# Patient Record
Sex: Male | Born: 1970 | Race: White | Hispanic: No | Marital: Married | Smoking: Never smoker
Health system: Southern US, Community
[De-identification: ages and names within clinical notes are randomized; demographics above are authoritative.]

## PROBLEM LIST (undated history)

## (undated) DIAGNOSIS — E785 Hyperlipidemia, unspecified: Secondary | ICD-10-CM

## (undated) DIAGNOSIS — R209 Unspecified disturbances of skin sensation: Secondary | ICD-10-CM

## (undated) DIAGNOSIS — E119 Type 2 diabetes mellitus without complications: Secondary | ICD-10-CM

## (undated) DIAGNOSIS — I10 Essential (primary) hypertension: Secondary | ICD-10-CM

## (undated) DIAGNOSIS — G4730 Sleep apnea, unspecified: Secondary | ICD-10-CM

## (undated) HISTORY — DX: Type 2 diabetes mellitus without complications: E11.9

## (undated) HISTORY — DX: Essential (primary) hypertension: I10

## (undated) HISTORY — DX: Unspecified disturbances of skin sensation: R20.9

## (undated) HISTORY — PX: WISDOM TOOTH EXTRACTION: SHX21

## (undated) HISTORY — PX: HAND SURGERY: SHX662

## (undated) HISTORY — DX: Sleep apnea, unspecified: G47.30

## (undated) HISTORY — DX: Hyperlipidemia, unspecified: E78.5

---

## 2012-09-03 ENCOUNTER — Ambulatory Visit (INDEPENDENT_AMBULATORY_CARE_PROVIDER_SITE_OTHER): Payer: BC Managed Care – PPO | Admitting: Neurology

## 2012-09-03 ENCOUNTER — Encounter: Payer: Self-pay | Admitting: Neurology

## 2012-09-03 VITALS — BP 142/80 | HR 73 | Ht 74.25 in | Wt 250.0 lb

## 2012-09-03 DIAGNOSIS — R209 Unspecified disturbances of skin sensation: Secondary | ICD-10-CM

## 2012-09-03 NOTE — Progress Notes (Signed)
Reason for visit: Numbness  Matthew Hogan is a 42 y.o. male  History of present illness:  [name] is a 42 year old right-handed white male with a history of numbness in the feet that dates back at least one year. The patient initially noted numbness mainly involving the middle 3 toes of both feet, the left side being slightly worse than the right. The patient has since noted a very gradual spread of the numbness to include the big toes well. The patient indicates that he has no alteration in balance, and he denies any back pain or pain down the legs. The patient indicates that there is no overt pain in the feet, only numbness and occasional tingling. The patient is able to rest well at night, and the feet do not bother him. The patient reports no numbness in the hands or fingers. The patient denies any neck pain. The patient denies any issues with control of the bowels or the bladder. The patient does not note that the symptoms are worse with overheating. The patient is able to remain quite active. The patient has a very strong family history for diabetes, found in both parents and 3 siblings. The patient himself however, does not have a history of diabetes. The patient has had thyroid studies and a vitamin B12 level checked that were unremarkable. The patient is sent to this office for an evaluation. The numbness in the feet mainly affects the top of the toes.  Past Medical History  Diagnosis Date  . Disturbance of skin sensation     History reviewed. No pertinent past surgical history.  Family History  Problem Relation Age of Onset  . Cancer - Lung Father   . Heart Problems Sister   . Heart Problems Brother   . Diabetes Brother   . Diabetes Brother   . Diabetes Brother     Social history:  reports that he has never smoked. He does not have any smokeless tobacco history on file. He reports that  drinks alcohol. He reports that he does not use illicit drugs.  Medications:  No current  outpatient prescriptions on file prior to visit.   No current facility-administered medications on file prior to visit.    Allergies: No Known Allergies  ROS:  Out of a complete 14 system review of symptoms, the patient complains only of the following symptoms, and all other reviewed systems are negative.  Numbness Insomnia Racing thoughts  Blood pressure 142/80, pulse 73, height 6' 2.25" (1.886 m), weight 250 lb (113.399 kg).  Physical Exam  General: The patient is alert and cooperative at the time of the examination. The patient is minimally obese.  Head: Pupils are equal, round, and reactive to light. Discs are flat bilaterally.  Neck: The neck is supple, no carotid bruits are noted.  Respiratory: The respiratory examination is clear.  Cardiovascular: The cardiovascular examination reveals a regular rate and rhythm, no obvious murmurs or rubs are noted.  Skin: Extremities are without significant edema.  Neurologic Exam  Mental status:  Cranial nerves: Facial symmetry is present. There is good sensation of the face to pinprick and soft touch bilaterally. The strength of the facial muscles and the muscles to head turning and shoulder shrug are normal bilaterally. Speech is well enunciated, no aphasia or dysarthria is noted. Extraocular movements are full. Visual fields are full.  Motor: The motor testing reveals 5 over 5 strength of all 4 extremities. Good symmetric motor tone is noted throughout.  Sensory: Sensory testing  is intact to pinprick, soft touch, vibration sensation, and position sense on all 4 extremities, with the exception that there is a pinprick sensory deficit across the ankles bilaterally. No evidence of extinction is noted.  Coordination: Cerebellar testing reveals good finger-nose-finger and heel-to-shin bilaterally.  Gait and station: Gait is normal. Tandem gait is normal. Romberg is negative. No drift is seen  Reflexes: Deep tendon reflexes are  symmetric and normal bilaterally. The ankle jerk reflexes are well-maintained bilaterally. Toes are downgoing bilaterally.   Assessment/Plan:  1. Bilateral foot numbness  The patient could have a very early peripheral neuropathy, and he has a very strong family history for diabetes. At times, a peripheral neuropathy may precede a diagnosis of diabetes, but his last fasting blood sugar in August of 2013 revealed a blood sugar of 77. The patient will be set up for further blood work, and nerve conduction studies will be done. The patient has well-maintained ankle jerk reflexes, and the peripheral neuropathy maybe too early to pick up on a standard nerve conduction study. The patient will followup in 6-8 months. There is no indication for medications, as the patient does not have pain or discomfort.  [name] MD 09/03/2012 9:15 AM  [facility] [address] [place], [place] [address]  Phone [phone] Fax [phone]

## 2012-09-04 LAB — PROTEIN ELECTROPHORESIS, SERUM: Alpha 1: 0.1 g/dL (ref 0.1–0.4)

## 2012-09-04 LAB — ANGIOTENSIN CONVERTING ENZYME: Angio Convert Enzyme: 48 U/L (ref 14–82)

## 2012-09-05 NOTE — Progress Notes (Signed)
Quick Note:  I called and left a message for the patient that his labs were normal. ______

## 2012-09-07 ENCOUNTER — Ambulatory Visit (INDEPENDENT_AMBULATORY_CARE_PROVIDER_SITE_OTHER): Payer: BC Managed Care – PPO | Admitting: Neurology

## 2012-09-07 ENCOUNTER — Encounter (INDEPENDENT_AMBULATORY_CARE_PROVIDER_SITE_OTHER): Payer: BC Managed Care – PPO

## 2012-09-07 DIAGNOSIS — Z0289 Encounter for other administrative examinations: Secondary | ICD-10-CM

## 2012-09-07 DIAGNOSIS — R209 Unspecified disturbances of skin sensation: Secondary | ICD-10-CM

## 2012-09-07 NOTE — Procedures (Signed)
  HISTORY:  Matthew Hogan is a 42 year old gentleman with a one-year history of some numbness into the middle toes of the feet bilaterally, left greater than right. The patient has no discomfort associated with this, and no low back pain or pain down the legs. The patient has a strong family history of diabetes, but he has no issues with blood sugar himself. The patient is being evaluated for a possible peripheral neuropathy.  NERVE CONDUCTION STUDIES:  Nerve conduction studies were performed on both lower extremities. The distal motor latencies and motor amplitudes for the peroneal and posterior tibial nerves were within normal limits. The nerve conduction velocities for these nerves were also normal. The H reflex latencies were normal. The sensory latencies for the peroneal nerves were within normal limits.    EMG STUDIES:  EMG study was performed on the left lower extremity:  The tibialis anterior muscle reveals 2 to 4K motor units with full recruitment. No fibrillations or positive waves were seen. The peroneus tertius muscle reveals 2 to 4K motor units with full recruitment. No fibrillations or positive waves were seen. The medial gastrocnemius muscle reveals 1 to 3K motor units with full recruitment. No fibrillations or positive waves were seen. The vastus lateralis muscle reveals 2 to 4K motor units with full recruitment. No fibrillations or positive waves were seen. The iliopsoas muscle reveals 2 to 4K motor units with full recruitment. No fibrillations or positive waves were seen. The biceps femoris muscle (long head) reveals 2 to 4K motor units with full recruitment. No fibrillations or positive waves were seen. The lumbosacral paraspinal muscles were tested at 3 levels, and revealed no abnormalities of insertional activity at all 3 levels tested. There was good relaxation.    IMPRESSION:  Nerve conduction studies done on both lower extremities were within normal limits. There is no  evidence of a peripheral neuropathy. A very early peripheral neuropathy however, may be missed on nerve conduction study. Clinical correlation is required. EMG evaluation of the left lower extremity was normal. There is no evidence of a left lumbosacral radiculopathy on this examination.  Matthew Palau MD 09/07/2012 4:14 PM  [facility] [address] [place], [place] [address]  Phone [phone] Fax [phone]

## 2012-10-02 ENCOUNTER — Encounter: Payer: Self-pay | Admitting: Neurology

## 2013-03-12 ENCOUNTER — Encounter: Payer: Self-pay | Admitting: Neurology

## 2013-03-12 ENCOUNTER — Ambulatory Visit (INDEPENDENT_AMBULATORY_CARE_PROVIDER_SITE_OTHER): Payer: BC Managed Care – PPO | Admitting: Neurology

## 2013-03-12 ENCOUNTER — Encounter (INDEPENDENT_AMBULATORY_CARE_PROVIDER_SITE_OTHER): Payer: Self-pay

## 2013-03-12 VITALS — BP 121/76 | HR 71 | Wt 244.0 lb

## 2013-03-12 DIAGNOSIS — R209 Unspecified disturbances of skin sensation: Secondary | ICD-10-CM

## 2013-03-12 NOTE — Progress Notes (Signed)
   Reason for visit: Numbness in the feet  Matthew Hogan is an 42 y.o. male  History of present illness:  Matthew Hogan is a 42 year old right-handed white male with a history of numbness in the toes of the feet. The patient has had nerve conduction studies and blood work done that were unremarkable. It is felt that the patient does have a peripheral neuropathy, but it is too early in the process to show on standard nerve conduction studies, or the neuropathy could be a small fiber neuropathy. The patient denies any problems with back pain, or pain down the legs. The patient denies balance issues or problems with controlling the bowels or the bladder. The patient is noting more discomfort at times in the feet, and he will sometimes have an "icy hot" sensation in the toes. At times, the discomfort keeps him awake at night. The patient comes to this office for an evaluation.  Past Medical History  Diagnosis Date  . Disturbance of skin sensation     History reviewed. No pertinent past surgical history.  Family History  Problem Relation Age of Onset  . Cancer - Lung Father   . Heart Problems Sister   . Heart Problems Brother   . Diabetes Brother   . Diabetes Brother   . Diabetes Brother     Social history:  reports that he has never smoked. He has never used smokeless tobacco. He reports that he drinks alcohol. He reports that he does not use illicit drugs.   No Known Allergies  Medications:  Current Outpatient Prescriptions on File Prior to Visit  Medication Sig Dispense Refill  . aspirin 81 MG chewable tablet Chew 81 mg by mouth daily.       No current facility-administered medications on file prior to visit.    ROS:  Out of a complete 14 system review of symptoms, the patient complains only of the following symptoms, and all other reviewed systems are negative.  Insomnia Numbness in the toes  Blood pressure 121/76, pulse 71, weight 244 lb (110.678 kg).  Physical Exam  General:  The patient is alert and cooperative at the time of the examination.  Skin: No significant peripheral edema is noted.   Neurologic Exam  Cranial nerves: Facial symmetry is present. Speech is normal, no aphasia or dysarthria is noted. Extraocular movements are full. Visual fields are full.  Motor: The patient has good strength in all 4 extremities.  Coordination: The patient has good finger-nose-finger and heel-to-shin bilaterally.  Gait and station: The patient has a normal gait. Tandem gait is normal. Romberg is negative. No drift is seen.  Reflexes: Deep tendon reflexes are symmetric, the ankle jerk reflexes are maintained bilaterally..   Assessment/Plan:  One. Probable peripheral neuropathy  The patient at this point does not wish to start medications for his peripheral neuropathy. At this point we will see patient back in one year, but the patient will contact our office if he desires to go on medication such as gabapentin at night.  [name] MD 03/12/2013 8:35 AM  [facility] [address] [place], [place] [address]  Phone [phone] Fax [phone]

## 2013-07-16 ENCOUNTER — Emergency Department (HOSPITAL_COMMUNITY): Payer: BC Managed Care – PPO

## 2013-07-16 ENCOUNTER — Encounter (HOSPITAL_COMMUNITY): Payer: Self-pay | Admitting: Emergency Medicine

## 2013-07-16 ENCOUNTER — Emergency Department (HOSPITAL_COMMUNITY)
Admission: EM | Admit: 2013-07-16 | Discharge: 2013-07-16 | Disposition: A | Discharge status: Home / Self Care | Payer: BC Managed Care – PPO | Attending: Emergency Medicine | Admitting: Emergency Medicine

## 2013-07-16 DIAGNOSIS — Y939 Activity, unspecified: Secondary | ICD-10-CM | POA: Insufficient documentation

## 2013-07-16 DIAGNOSIS — Y929 Unspecified place or not applicable: Secondary | ICD-10-CM | POA: Insufficient documentation

## 2013-07-16 DIAGNOSIS — S61216A Laceration without foreign body of right little finger without damage to nail, initial encounter: Secondary | ICD-10-CM

## 2013-07-16 DIAGNOSIS — Z7982 Long term (current) use of aspirin: Secondary | ICD-10-CM | POA: Insufficient documentation

## 2013-07-16 DIAGNOSIS — Z23 Encounter for immunization: Secondary | ICD-10-CM | POA: Insufficient documentation

## 2013-07-16 DIAGNOSIS — S61209A Unspecified open wound of unspecified finger without damage to nail, initial encounter: Secondary | ICD-10-CM | POA: Insufficient documentation

## 2013-07-16 DIAGNOSIS — W010XXA Fall on same level from slipping, tripping and stumbling without subsequent striking against object, initial encounter: Secondary | ICD-10-CM | POA: Insufficient documentation

## 2013-07-16 MED ORDER — BUPIVACAINE HCL (PF) 0.25 % IJ SOLN
30.0000 mL | Freq: Once | INTRAMUSCULAR | Status: DC
  Filled 2013-07-16: qty 30

## 2013-07-16 MED ORDER — HYDROCODONE-ACETAMINOPHEN 5-325 MG PO TABS: 1.0000 | ORAL_TABLET | ORAL | Status: DC | PRN

## 2013-07-16 MED ORDER — IBUPROFEN 600 MG PO TABS: 600.0000 mg | ORAL_TABLET | Freq: Three times a day (TID) | ORAL | Status: DC | PRN

## 2013-07-16 MED ORDER — TETANUS-DIPHTH-ACELL PERTUSSIS 5-2.5-18.5 LF-MCG/0.5 IM SUSP
0.5000 mL | Freq: Once | INTRAMUSCULAR | Status: AC
  Administered 2013-07-16: 0.5 mL via INTRAMUSCULAR
  Filled 2013-07-16: qty 0.5

## 2013-07-16 NOTE — ED Notes (Signed)
Dr. Campos in with pt 

## 2013-07-16 NOTE — Discharge Instructions (Signed)

## 2013-07-16 NOTE — ED Provider Notes (Signed)
CSN: [id number]     Arrival date & time 07/16/13  1009 History   First MD Initiated Contact with Patient 07/16/13 1031     Chief Complaint  Patient presents with  . Laceration     (Consider location/radiation/quality/duration/timing/severity/associated sxs/prior Treatment) HPI Patient reports he slipped on the ice when he tried to catch his fall with his right hand it resulted in a laceration of his right little finger.  His pain is moderate in severity at this time.  Tetanus has been updated in the past 5 years.  No other injury or complaints.   Past Medical History  Diagnosis Date  . Disturbance of skin sensation    History reviewed. No pertinent past surgical history. Family History  Problem Relation Age of Onset  . Cancer - Lung Father   . Heart Problems Sister   . Heart Problems Brother   . Diabetes Brother   . Diabetes Brother   . Diabetes Brother    History  Substance Use Topics  . Smoking status: Never Smoker   . Smokeless tobacco: Never Used  . Alcohol Use: Yes     Comment: One beer weekly    Review of Systems  All other systems reviewed and are negative.      Allergies  Erythromycin  Home Medications   Current Outpatient Rx  Name  Route  Sig  Dispense  Refill  . aspirin 81 MG chewable tablet   Oral   Chew 81 mg by mouth daily.         Marland Kitchen HYDROcodone-acetaminophen (NORCO/VICODIN) 5-325 MG per tablet   Oral   Take 1 tablet by mouth every 4 (four) hours as needed for moderate pain.   15 tablet   0   . ibuprofen (ADVIL,MOTRIN) 600 MG tablet   Oral   Take 1 tablet (600 mg total) by mouth every 8 (eight) hours as needed.   15 tablet   0    BP 129/76  Pulse 85  Temp(Src) 98.5 F (36.9 C) (Oral)  Resp 16  SpO2 99% Physical Exam  Nursing note and vitals reviewed. Constitutional: He is oriented to person, place, and time. He appears well-developed and well-nourished.  HENT:  Head: Normocephalic.  Eyes: EOM are normal.  Neck: Normal range  of motion.  Pulmonary/Chest: Effort normal.  Abdominal: He exhibits no distension.  Musculoskeletal: Normal range of motion.  Laceration involving the volar surface of the right little finger.  The laceration overlies the distal and medial phalanx.  This is a angulated laceration with a flap of skin.  The patient has full flexion in the right little finger to suggest the flexor tendon is intact.  There is no bone exposed.  There is no tendon sheath exposed.  Normal perfusion distally.  Neurological: He is alert and oriented to person, place, and time.  Psychiatric: He has a normal mood and affect.    ED Course  Procedures (including critical care time)  NERVE BLOCK Performed by: Lyanne Co Consent: Verbal consent obtained. Required items: required blood products, implants, devices, and special equipment available Time out: Immediately prior to procedure a "time out" was called to verify the correct patient, procedure, equipment, support staff and site/side marked as required. Indication: laceration repair Nerve block body site: right little finger digital nerves Preparation: Patient was prepped and draped in the usual sterile fashion. Needle gauge: 24 G Location technique: anatomical landmarks Local anesthetic: marcaine 0.25% without epi Anesthetic total: 5 ml Outcome: pain improved Patient tolerance: Patient  tolerated the procedure well with no immediate complications.   LACERATION REPAIR Performed by: Lyanne CoAMPOS,Aina Rossbach M Consent: Verbal consent obtained. Risks and benefits: risks, benefits and alternatives were discussed Patient identity confirmed: provided demographic data Time out performed prior to procedure Prepped and Draped in normal sterile fashion Wound explored Complex laceration Laceration Location: Right little finger Laceration Length: 3.5 cm No Foreign Bodies seen or palpated Anesthesia: Nerve block (see note) Irrigation method: syringe Amount of cleaning:  standard Skin closure: Layered closure, 4-0 Prolene, 4-0 Vicryl  Number of sutures or staples: 3 Deep sutures, 8 superficial  Technique: Simple interrupted  Patient tolerance: Patient tolerated the procedure well with no immediate complications.   Labs Review Labs Reviewed - No data to display Imaging Review No results found.  EKG Interpretation   None       MDM   Final diagnoses:  Laceration of right little finger w/o foreign body w/o damage to nail    Infection warnings given.  Sutures removed in 10 days.  Images completed to evaluate for underlying fracture.  Doubt.  No signs of dislocation.  Normal flexion of the fingers.    Lyanne CoKevin M Dewayne Jurek, MD 07/16/13 [phone]

## 2013-07-16 NOTE — ED Notes (Signed)
Pt slipped and fell, not sure what landed on or what grabbed on way down, large lac on right pinky finger

## 2013-07-21 ENCOUNTER — Encounter (HOSPITAL_COMMUNITY): Payer: Self-pay | Admitting: Emergency Medicine

## 2013-07-21 ENCOUNTER — Emergency Department (HOSPITAL_COMMUNITY)
Admission: EM | Admit: 2013-07-21 | Discharge: 2013-07-21 | Disposition: A | Discharge status: Home / Self Care | Payer: BC Managed Care – PPO | Attending: Emergency Medicine | Admitting: Emergency Medicine

## 2013-07-21 DIAGNOSIS — Y838 Other surgical procedures as the cause of abnormal reaction of the patient, or of later complication, without mention of misadventure at the time of the procedure: Secondary | ICD-10-CM | POA: Insufficient documentation

## 2013-07-21 DIAGNOSIS — T148XXA Other injury of unspecified body region, initial encounter: Secondary | ICD-10-CM

## 2013-07-21 DIAGNOSIS — T8140XA Infection following a procedure, unspecified, initial encounter: Secondary | ICD-10-CM | POA: Insufficient documentation

## 2013-07-21 DIAGNOSIS — Z7982 Long term (current) use of aspirin: Secondary | ICD-10-CM | POA: Insufficient documentation

## 2013-07-21 DIAGNOSIS — Z8669 Personal history of other diseases of the nervous system and sense organs: Secondary | ICD-10-CM | POA: Insufficient documentation

## 2013-07-21 DIAGNOSIS — L089 Local infection of the skin and subcutaneous tissue, unspecified: Secondary | ICD-10-CM

## 2013-07-21 MED ORDER — CEPHALEXIN 500 MG PO CAPS
500.0000 mg | ORAL_CAPSULE | Freq: Once | ORAL | Status: AC
  Administered 2013-07-21: 500 mg via ORAL
  Filled 2013-07-21: qty 1

## 2013-07-21 MED ORDER — HYDROCODONE-ACETAMINOPHEN 5-325 MG PO TABS
2.0000 | ORAL_TABLET | Freq: Once | ORAL | Status: AC
  Administered 2013-07-21: 2 via ORAL
  Filled 2013-07-21: qty 2

## 2013-07-21 MED ORDER — SULFAMETHOXAZOLE-TRIMETHOPRIM 800-160 MG PO TABS
1.0000 | ORAL_TABLET | Freq: Two times a day (BID) | ORAL | Status: DC
Start: 2013-07-21 — End: 2014-03-12

## 2013-07-21 MED ORDER — BACITRACIN 500 UNIT/GM EX OINT
1.0000 | TOPICAL_OINTMENT | Freq: Two times a day (BID) | CUTANEOUS | Status: DC
Start: 2013-07-21 — End: 2013-07-21
  Administered 2013-07-21: 1 via TOPICAL
  Filled 2013-07-21: qty 0.9

## 2013-07-21 MED ORDER — CEPHALEXIN 500 MG PO CAPS: ORAL_CAPSULE | ORAL | Status: DC

## 2013-07-21 MED ORDER — SULFAMETHOXAZOLE-TMP DS 800-160 MG PO TABS
1.0000 | ORAL_TABLET | Freq: Once | ORAL | Status: AC
  Administered 2013-07-21: 1 via ORAL
  Filled 2013-07-21: qty 1

## 2013-07-21 NOTE — ED Notes (Signed)
Pt reports he fell and cut right pinkie finger on 2/17, came to ED, 18 stitches in place. Pt reports on Wednesday pt turned black, on Thursday pt wound started oozing puss, still oozing at present. Odor present. Pain 2/10.

## 2013-07-21 NOTE — ED Provider Notes (Signed)
CSN: [id number]     Arrival date & time 07/21/13  1808 History   First MD Initiated Contact with Patient 07/21/13 1840    Chief complaint pain at right fifth finger No chief complaint on file.    (Consider location/radiation/quality/duration/timing/severity/associated sxs/prior Treatment) HPI Patient suffered laceration to his right fifth finger on 07/16/2013. Laceration was repaired here. For the past 2 days his finger feels tight and diffusely sore. He denies nausea. Denies malaise denies fever he's been treating himself with Motrin and Norco partially. No other associated symptoms. Past Medical History  Diagnosis Date  . Disturbance of skin sensation    peripheral neuropathy History reviewed. No pertinent past surgical history. Family History  Problem Relation Age of Onset  . Cancer - Lung Father   . Heart Problems Sister   . Heart Problems Brother   . Diabetes Brother   . Diabetes Brother   . Diabetes Brother    History  Substance Use Topics  . Smoking status: Never Smoker   . Smokeless tobacco: Never Used  . Alcohol Use: Yes     Comment: One beer weekly    Review of Systems  Constitutional: Negative.   Gastrointestinal: Negative.   Skin: Positive for wound.      Allergies  Erythromycin  Home Medications   Current Outpatient Rx  Name  Route  Sig  Dispense  Refill  . aspirin 81 MG chewable tablet   Oral   Chew 81 mg by mouth daily.         Marland Kitchen. HYDROcodone-acetaminophen (NORCO/VICODIN) 5-325 MG per tablet   Oral   Take 1 tablet by mouth every 4 (four) hours as needed for moderate pain.   15 tablet   0   . ibuprofen (ADVIL,MOTRIN) 600 MG tablet   Oral   Take 1 tablet (600 mg total) by mouth every 8 (eight) hours as needed.   15 tablet   0    BP 141/92  Pulse 77  Temp(Src) 97.6 F (36.4 C) (Oral)  Resp 16  SpO2 96% Physical Exam  Vitals reviewed. Constitutional: He appears well-developed and well-nourished.  HENT:  Head: Normocephalic and  atraumatic.  Neck: Normal range of motion.  Cardiovascular: Normal rate.   Pulmonary/Chest: Effort normal.  Abdominal: He exhibits no distension.  Musculoskeletal:  Right hand fifth finger with a sutured laceration on volar aspect wound is irregular. There is gross pus emanating from the wound between the sutures. Mild diffuse tenderness and redness on volar aspect the finger. He is able to flex the finger. Deep flexor and superficial flexor function preserved.   No red streaks proximal to finger. ED Course  Procedures (including critical care time) Labs Review Labs Reviewed - No data to display Imaging Review No results found.  EKG Interpretation   None       MDM   Final diagnoses:  None    all skin sutures were removed. Patient irrigated his finger under faucet copious tap water Spoke with Dr.Gramig plan prescriptions Keflex, Bactrim, patient has Vicodin at home. He will be placed in a bulky dressing with bacitracin ointment. Complaint has been scheduled with Dr.Gramig's office tomorrow at 1 PM. Diagnosis wound infection of right fifth finger   Doug SouSam Lanetra Hartley, MD 07/21/13 2000

## 2013-07-21 NOTE — Discharge Instructions (Signed)
Take your Vicodin as needed for pain. Take the antibiotics as prescribed. Take probiotic as directed. Can be purchased at most pharmacies Eat 1 cup of yogurt daily to prevent diarrhea from antibiotics

## 2014-03-10 ENCOUNTER — Other Ambulatory Visit: Payer: Self-pay | Admitting: Cardiology

## 2014-03-10 DIAGNOSIS — R079 Chest pain, unspecified: Secondary | ICD-10-CM

## 2014-03-12 ENCOUNTER — Encounter: Payer: Self-pay | Admitting: Neurology

## 2014-03-12 ENCOUNTER — Ambulatory Visit (INDEPENDENT_AMBULATORY_CARE_PROVIDER_SITE_OTHER): Payer: BC Managed Care – PPO | Admitting: Neurology

## 2014-03-12 ENCOUNTER — Encounter (INDEPENDENT_AMBULATORY_CARE_PROVIDER_SITE_OTHER): Payer: Self-pay

## 2014-03-12 VITALS — BP 118/80 | HR 69 | Ht 74.0 in | Wt 259.4 lb

## 2014-03-12 DIAGNOSIS — R209 Unspecified disturbances of skin sensation: Secondary | ICD-10-CM

## 2014-03-12 NOTE — Patient Instructions (Signed)

## 2014-03-12 NOTE — Progress Notes (Signed)
    Reason for visit: Numbness  Matthew Hogan is an 43 y.o. male  History of present illness:  [name] is a 43 year old right-handed white male with a history of numbness in the feet that is felt to be related to an early peripheral neuropathy. Nerve conduction studies were normal. The patient has numbness mainly on the ball of the feet, possibly up to the ankle at times. The patient generally is not bothered much by the numbness, but at times, he finds that his feet are hypersensitive. The patient is sleeping fairly well at night, not bothered by the feet. He denies any significant balance issues. He does have some low back pain off and on, without radiation down the legs. He denies issues controlling the bowels or the bladder. He has no numbness on the hands.   Past Medical History  Diagnosis Date  . Disturbance of skin sensation     History reviewed. No pertinent past surgical history.  Family History  Problem Relation Age of Onset  . Cancer - Lung Father   . Heart Problems Sister   . Heart Problems Brother   . Diabetes Brother   . Diabetes Brother   . Diabetes Brother     Social history:  reports that he has never smoked. He has never used smokeless tobacco. He reports that he drinks alcohol. He reports that he does not use illicit drugs.    Allergies  Allergen Reactions  . Erythromycin     Childhood allergy    Medications:  Current Outpatient Prescriptions on File Prior to Visit  Medication Sig Dispense Refill  . aspirin 81 MG chewable tablet Chew 81 mg by mouth daily.       No current facility-administered medications on file prior to visit.    ROS:  Out of a complete 14 system review of symptoms, the patient complains only of the following symptoms, and all other reviewed systems are negative.  Numbness  Insomnia  Blood pressure 118/80, pulse 69, height 6\' 2"  (1.88 m), weight 259 lb 6.4 oz (117.663 kg).  Physical Exam  General: The patient is alert and  cooperative at the time of the examination.  Skin: No significant peripheral edema is noted.   Neurologic Exam  Mental status: The patient is oriented x 3.  Cranial nerves: Facial symmetry is present. Speech is normal, no aphasia or dysarthria is noted. Extraocular movements are full. Visual fields are full.  Motor: The patient has good strength in all 4 extremities.  Sensory examination: Soft touch sensation is symmetric on the face, arms, or legs. The patient has a stocking pattern pinprick sensory deficit up to the ankle bilaterally.   Coordination: The patient has good finger-nose-finger and heel-to-shin bilaterally.  Gait and station: The patient has a normal gait. Tandem gait is normal. Romberg is negative. No drift is seen. The patient is able to walk on heels and the toes bilaterally.   Reflexes: Deep tendon reflexes are symmetric.   Assessment/Plan:   1. Numbness of the feet  The patient likely does have a very early peripheral neuropathy. The patient does not wish to go on medications for the neuropathy as the discomfort is relatively minor at this time. He will contact me if he changes his mind. Otherwise he will followup in 9-12 months.   [name]. Keith [name] MD 03/12/2014 12:37 PM  [facility] [address] [place], [place] [address]  Phone [phone] Fax [phone]\

## 2014-03-14 ENCOUNTER — Other Ambulatory Visit: Payer: BC Managed Care – PPO

## 2014-03-19 ENCOUNTER — Ambulatory Visit
Admission: RE | Admit: 2014-03-19 | Discharge: 2014-03-19 | Disposition: A | Discharge status: Home / Self Care | Payer: No Typology Code available for payment source | Source: Ambulatory Visit | Attending: Cardiology | Admitting: Cardiology

## 2014-03-19 DIAGNOSIS — R079 Chest pain, unspecified: Secondary | ICD-10-CM

## 2014-03-20 ENCOUNTER — Other Ambulatory Visit: Payer: BC Managed Care – PPO

## 2014-11-24 DIAGNOSIS — Z0289 Encounter for other administrative examinations: Secondary | ICD-10-CM

## 2014-12-11 ENCOUNTER — Encounter: Payer: Self-pay | Admitting: Adult Health

## 2014-12-11 ENCOUNTER — Ambulatory Visit (INDEPENDENT_AMBULATORY_CARE_PROVIDER_SITE_OTHER): Payer: BLUE CROSS/BLUE SHIELD | Admitting: Adult Health

## 2014-12-11 VITALS — BP 133/88 | HR 66 | Ht 74.0 in | Wt 266.0 lb

## 2014-12-11 DIAGNOSIS — R209 Unspecified disturbances of skin sensation: Secondary | ICD-10-CM | POA: Diagnosis not present

## 2014-12-11 NOTE — Patient Instructions (Signed)
Overall your symptoms have remained stable.  If you start to experience burning/tingling pain we could start gabapentin. If your symptoms worsen or you develop new symptoms please let us know.

## 2014-12-11 NOTE — Progress Notes (Signed)
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 Patient returns today for follow-up. Patient states that the numbness is mainly on the ball of the feet and will occasionally extend to the ankles. He states maybe once a month he'll have a severe episode where he feels like he is wearing heavy boots on both feet. He states these episodes last one or 2 days and then it resolves. His feet continue to be hypersensitive at times. He states that he jumped into water and had to immediately get out due to his feet. Patient denies any changes with his gait or balance. Denies any issues with his bowels or bladder. No changes in his vision. Denies any new neurological symptoms. He returns today for an evaluation.  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 Nerve conduction studies were normal. The patient has numbness mainly on the ball of the feet, possibly up to the ankle at times. The patient generally is not bothered much by the numbness, but at times, he finds that his feet are hypersensitive. The patient is sleeping fairly well at night, not bothered by the feet. He denies any significant balance issues. He does have some low back pain off and on, without radiation down the legs. He denies issues controlling the bowels or the bladder. He has no numbness on the hands.   REVIEW OF SYSTEMS: Out of a complete 14 system review of symptoms, the patient complains only of the following symptoms, and all other reviewed systems are negative.  See HPI  ALLERGIES: Allergies  Allergen Reactions  . Erythromycin     Childhood allergy    HOME  MEDICATIONS: Outpatient Prescriptions Prior to Visit  Medication Sig Dispense Refill  . aspirin 81 MG chewable tablet Chew 81 mg by mouth daily.     No facility-administered medications prior to visit.    PAST MEDICAL HISTORY: Past Medical History  Diagnosis Date  . Disturbance of skin sensation     PAST SURGICAL HISTORY: Past Surgical History  Procedure Laterality Date  . Wisdom tooth extraction    . Hand surgery Right     FAMILY HISTORY: Family History  Problem Relation Age of Onset  . Cancer - Lung Father   . Heart Problems Sister   . Heart Problems Brother   . Diabetes Brother   . Diabetes Brother   . Diabetes Brother     SOCIAL HISTORY: History   Social History  . Marital Status: Married    Spouse Name: Idalia Needle  . Number of Children: 2  . Years of Education: College   Occupational History  .      Self employed   Social History Main Topics  . Smoking status: Never Smoker   . Smokeless tobacco: Never Used  . Alcohol Use: 0.0 oz/week    0 Standard drinks or equivalent per week     Comment: One beer weekly  . Drug Use: No  . Sexual Activity: Not on file   Other Topics Concern  . Not on file   Social History Narrative   Patient lives at home with his wife Idalia Needle).   Patient works  full time.   Education college.   Right handed.   Caffeine sweet daily two cups.      PHYSICAL EXAM  Filed Vitals:   12/11/14 0832  BP: 133/88  Pulse: 66  Height: 6\' 2"  (1.88 m)  Weight: 266 lb (120.657 kg)   Body mass index is 34.14 kg/(m^2).  Generalized: Well developed, in no acute distress   Neurological examination  Mentation: Alert oriented to time, place, history taking. Follows all commands speech and language fluent Cranial nerve II-XII: Pupils were equal round reactive to light. Extraocular movements were full, visual field were full on confrontational test. Facial sensation and strength were normal. Uvula tongue midline. Head turning and shoulder  shrug  were normal and symmetric. Motor: The motor testing reveals 5 over 5 strength of all 4 extremities. Good symmetric motor tone is noted throughout.  Sensory: Sensory testing is intact to soft touch, pinprick and vibration sensation on all 4 extremities. No evidence of extinction is noted.  Coordination: Cerebellar testing reveals good finger-nose-finger and heel-to-shin bilaterally.  Gait and station: Gait is normal. Tandem gait is normal. Romberg is negative. No drift is seen.  Reflexes: Deep tendon reflexes are symmetric and normal bilaterally.   DIAGNOSTIC DATA (LABS, IMAGING, TESTING) - I reviewed patient records, labs, notes, testing and imaging myself where available.     ASSESSMENT AND PLAN 44 y.o. year old male  has a past medical history of Disturbance of skin sensation. here with:  1. Disturbance of skin sensation  Overall the patient has remained stable. He continues to mainly have numbness with minimal discomfort. We will continue to monitor this. Patient advised that if he starts to experience burning or tingling pain in the feet he should let us know. Of course if his symptoms worsen or he develops new symptoms he should let us know as well. He will follow-up in one year or sooner if needed.  I spent 15 minutes with the patient. 50% of this time was spent counseling the patient on signs and symptoms of neuropathy.  Butch PennyMegan Borden Thune, MSN, NP-C 12/11/2014, 8:35 AM [facility] [address], [address] [place], [place] [address] [phone]  Note: This document was prepared with digital dictation and possible smart phrase technology. Any transcriptional errors that result from this process are unintentional.

## 2014-12-11 NOTE — Progress Notes (Signed)
I have read the note, and I agree with the clinical assessment and plan.  Gaynor Genco KEITH   

## 2015-12-15 ENCOUNTER — Ambulatory Visit (INDEPENDENT_AMBULATORY_CARE_PROVIDER_SITE_OTHER): Payer: BLUE CROSS/BLUE SHIELD | Admitting: Adult Health

## 2015-12-15 ENCOUNTER — Telehealth: Payer: Self-pay

## 2015-12-15 ENCOUNTER — Encounter: Payer: Self-pay | Admitting: Adult Health

## 2015-12-15 VITALS — BP 128/90 | HR 78 | Ht 74.0 in | Wt 264.8 lb

## 2015-12-15 DIAGNOSIS — R209 Unspecified disturbances of skin sensation: Secondary | ICD-10-CM

## 2015-12-15 NOTE — Patient Instructions (Signed)
  Overall he remained stable. We will continue to monitor symptoms. If your symptoms worsen or you develop new symptoms please let us know.

## 2015-12-15 NOTE — Telephone Encounter (Addendum)
Entered in error

## 2015-12-15 NOTE — Progress Notes (Signed)
I agree with the above plan 

## 2015-12-15 NOTE — Progress Notes (Signed)
PATIENT: Matthew Hogan DOB: [birth year]  REASON FOR VISIT: follow up- numbness HISTORY FROM: patient  HISTORY OF PRESENT ILLNESS: [name] is a 45 year old male with a history of numbness in the feet. He returns today for his yearly visit. He reports that his symptoms have remained relatively the same. He continues to have numbness in the ball of the feet that extends to the ankles. He reports that this numbness is intermittent. Some days are worse than others he does have days that he does not notice any numbness. Occasionally with the numbness he will have a mild "electric-like pain."He does report that his feet are hypersensitive at times. He denies any changes with his gait or balance. No changes with his vision, bowels or bladder. He returns today for an evaluation.  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 Patient returns today for follow-up. Patient states that the numbness is mainly on the ball of the feet and will occasionally extend to the ankles. He states maybe once a month he'll have a severe episode where he feels like he is wearing heavy boots on both feet. He states these episodes last one or 2 days and then it resolves. His feet continue to be hypersensitive at times. He states that he jumped into water and had to immediately get out due to his feet. Patient denies any changes with his gait or balance. Denies any issues with his bowels or bladder. No changes in his vision. Denies any new neurological symptoms. He returns today for an evaluation.  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 Nerve conduction studies were normal. The patient has numbness mainly on the ball of the feet, possibly up to the ankle at times. The patient generally is not bothered  much by the numbness, but at times, he finds that his feet are hypersensitive. The patient is sleeping fairly well at night, not bothered by the feet. He denies any significant balance issues. He does have some low back pain off and on, without radiation down the legs. He denies issues controlling the bowels or the bladder. He has no numbness on the hands.   REVIEW OF SYSTEMS: Out of a complete 14 system review of symptoms, the patient complains only of the following symptoms, and all other reviewed systems are negative.  Apnea, snoring  ALLERGIES: Allergies  Allergen Reactions  . Erythromycin     Childhood allergy    HOME MEDICATIONS: No outpatient prescriptions prior to visit.   No facility-administered medications prior to visit.    PAST MEDICAL HISTORY: Past Medical History  Diagnosis Date  . Disturbance of skin sensation     PAST SURGICAL HISTORY: Past Surgical History  Procedure Laterality Date  . Wisdom tooth extraction    . Hand surgery Right     FAMILY HISTORY: Family History  Problem Relation Age of Onset  . Cancer - Lung Father   . Heart Problems Sister   . Heart Problems Brother   . Diabetes Brother   . Diabetes Brother   . Diabetes Brother     SOCIAL HISTORY: Social History   Social History  . Marital Status: Married    Spouse Name: Matthew Hogan  . Number of Children: 2  . Years of Education: [employer]  Occupational History  .      Self employed   Social History Main Topics  . Smoking status: Never Smoker   . Smokeless tobacco: Never Used  . Alcohol Use: 0.0 oz/week    0 Standard drinks or equivalent per week     Comment: One beer weekly  . Drug Use: No  . Sexual Activity: Not on file   Other Topics Concern  . Not on file   Social History Narrative   Patient lives at home with his wife Matthew Hogan(Paige).   Patient works full time.   Education college.   Right handed.   Caffeine sweet daily two cups.      PHYSICAL EXAM  Filed Vitals:    12/15/15 0838  BP: 128/90  Pulse: 78  Height: 6\' 2"  (1.88 m)  Weight: 264 lb 12.8 oz (120.112 kg)   Body mass index is 33.98 kg/(m^2).  Generalized: Well developed, in no acute distress   Neurological examination  Mentation: Alert oriented to time, place, history taking. Follows all commands speech and language fluent Cranial nerve II-XII: Pupils were equal round reactive to light. Extraocular movements were full, visual field were full on confrontational test. Facial sensation and strength were normal. Uvula tongue midline. Head turning and shoulder shrug  were normal and symmetric. Motor: The motor testing reveals 5 over 5 strength of all 4 extremities. Good symmetric motor tone is noted throughout.  Sensory: Sensory testing is intact to soft touch on all 4 extremities. No evidence of extinction is noted.  Coordination: Cerebellar testing reveals good finger-nose-finger and heel-to-shin bilaterally.  Gait and station: Gait is normal. Tandem gait is normal. Romberg is negative. No drift is seen.  Reflexes: Deep tendon reflexes are symmetric and normal bilaterally.   DIAGNOSTIC DATA (LABS, IMAGING, TESTING) - I reviewed patient records, labs, notes, testing and imaging myself where available.   ASSESSMENT AND PLAN 45 y.o. year old male  has a past medical history of Disturbance of skin sensation. here with:  1. Disturbance of skin sensation  The patient has remained stable. At this time the patient only has numbness in the feet no severe discomfort. We will continue to monitor. Patient advised that if his symptoms worsen or he develops any new symptoms he should let us know. He will follow-up in one year with Dr. Anne HahnWillis.    Butch PennyMegan Dhanvin Szeto, MSN, NP-C 12/15/2015, 8:36 AM [facility] [address], [address] [place], [place] [address] [phone]

## 2015-12-26 IMAGING — CT CT HEART SCORING
1 of 2 series · 11 of 20 positions shown, 14 images · non-contrast
Comparison: No priors.

CLINICAL DATA: 43-year-old male with strong family history of heart
disease (in father and siblings), with history of chest pain for
over a year.

EXAM:
CT HEART FOR CALCIUM SCORING
TECHNIQUE: CT heart was performed on a 64 channel system using prospective ECG
gating. A scout and noncontrast exam (for calcium scoring) were
performed. Note that this exam targets the heart and the chest was
not imaged in its entirety.

[Series 2: smartscore - gated 0.4 sec · axial · 0.49mm/px · z∈[-223,-108]mm · 11 of 56 slices shown, 14 images]
[im 5/56  vessel]
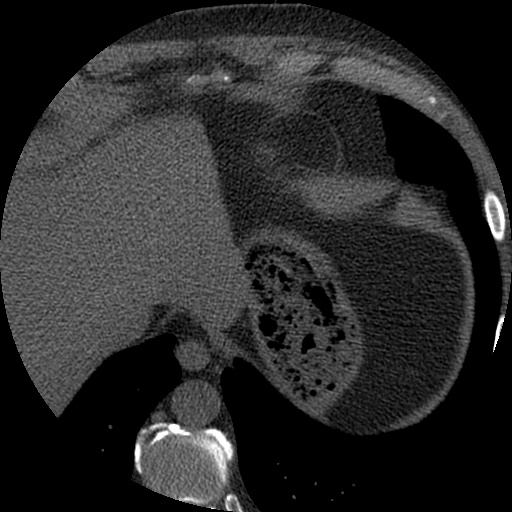
[im 5/56  lung]
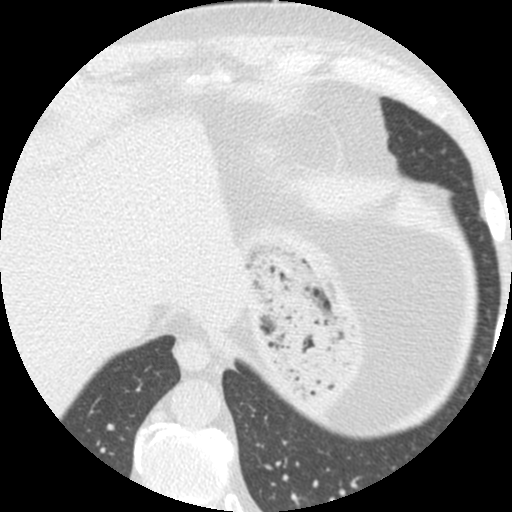
[im 10/56  vessel]
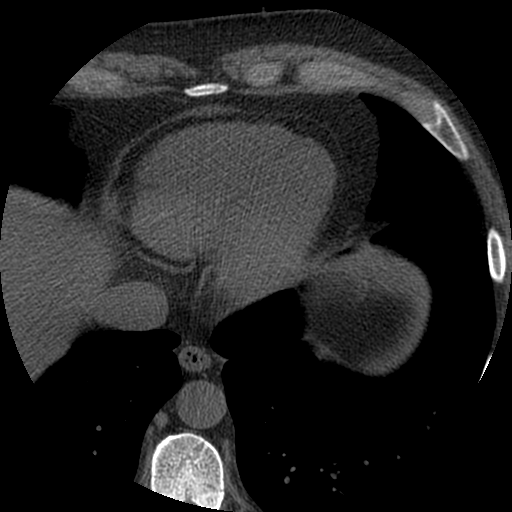
[im 14/56  vessel]
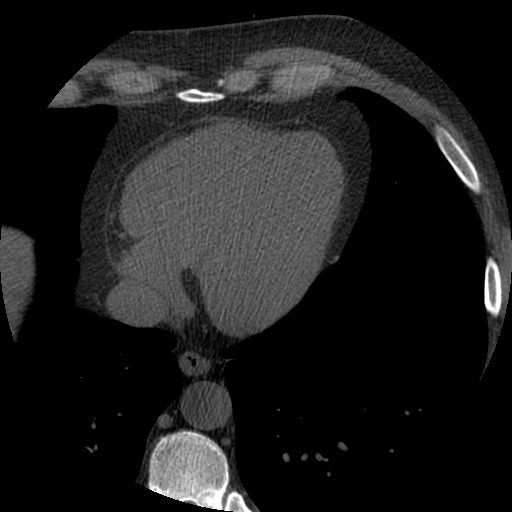
[im 19/56  vessel]
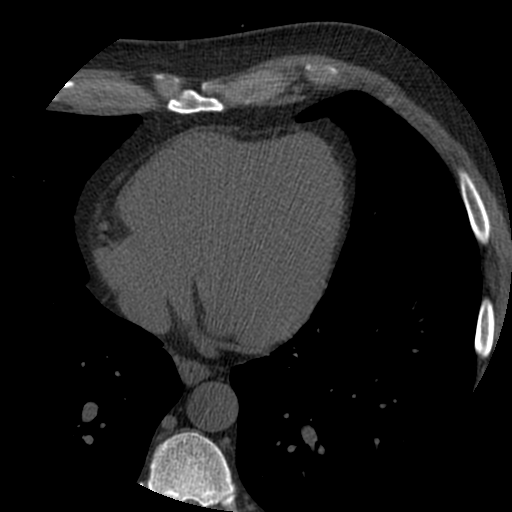
[im 23/56  vessel]
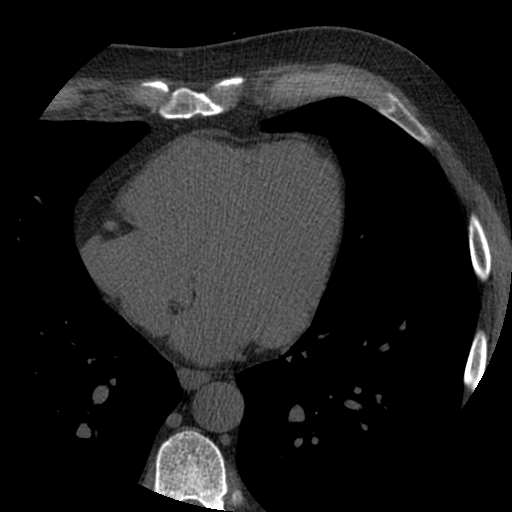
[im 23/56  lung]
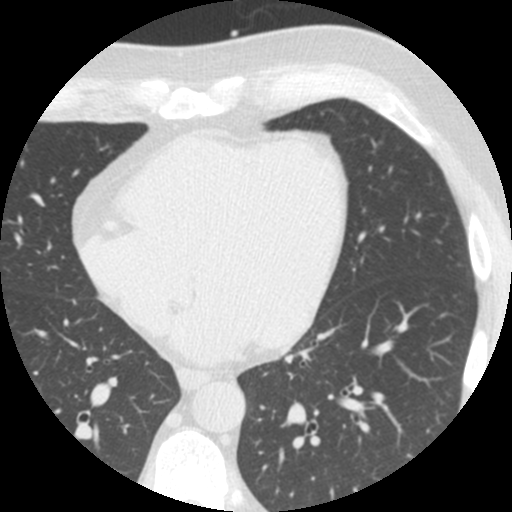
[im 28/56  vessel]
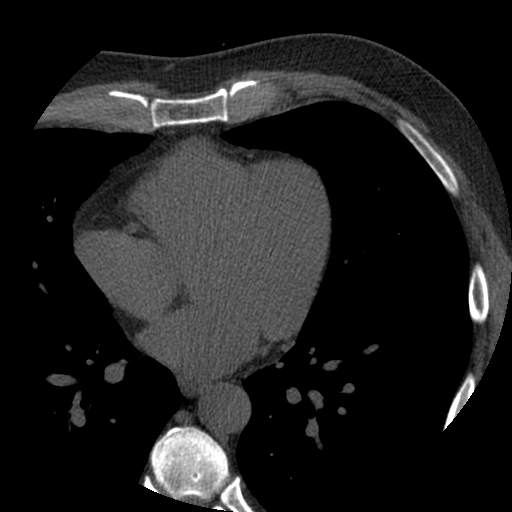
[im 33/56  vessel]
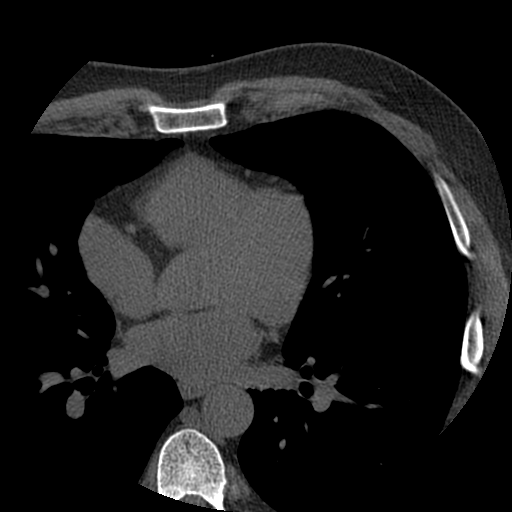
[im 37/56  vessel]
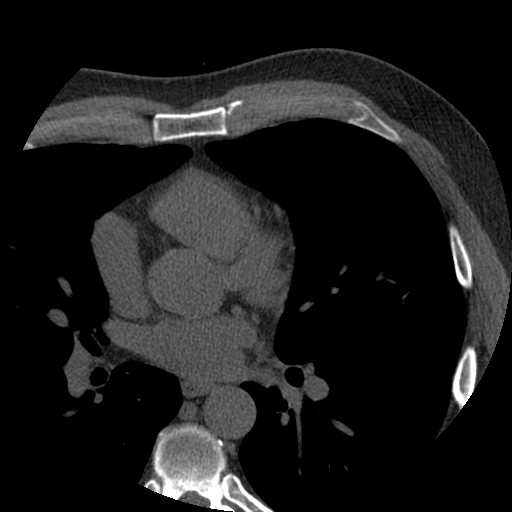
[im 42/56  vessel]
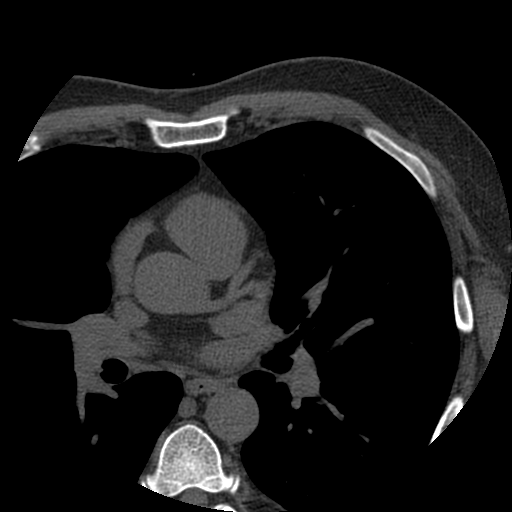
[im 42/56  lung]
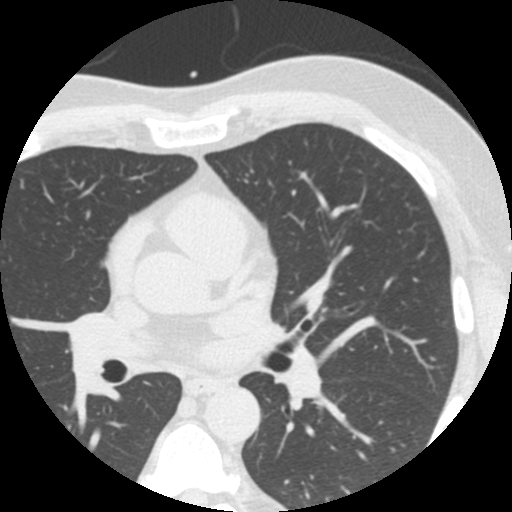
[im 46/56  vessel]
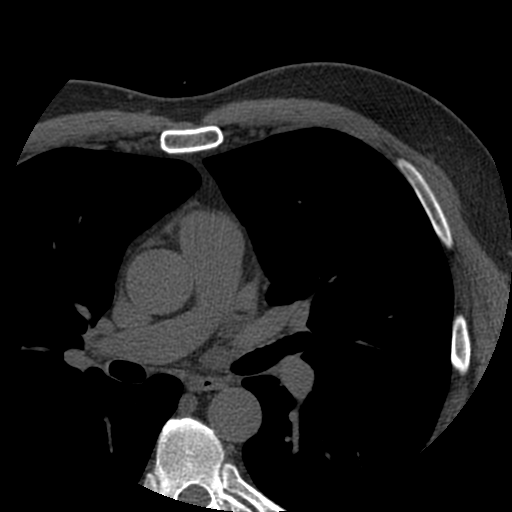
[im 51/56  vessel]
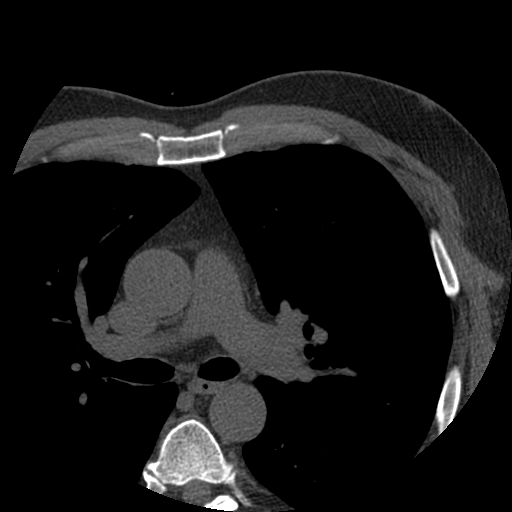

[11 of 20 positions shown; findings below may reference images not displayed]

FINDINGS: Technical quality: Excellent.

CORONARY CALCIUM

Total Agatston Score: 0

[HOSPITAL] percentile:  N/A

EXTRACARDIAC FINDINGS:

Within the visualized portions of the thorax there is no acute
consolidative airspace disease, no pleural effusions, no suspicious
appearing pulmonary nodule and no mediastinal adenopathy. Visualized
portions of the upper abdomen are unremarkable. There are no
aggressive appearing lytic or blastic lesions noted in the
visualized portions of the skeleton.
IMPRESSION: 1. Patient's total coronary artery calcium score is 0. This
indicates very low risk of MACE (less than 1% 10-year risk).
2. No significant incidental noncardiac findings noted.

## 2016-12-14 ENCOUNTER — Ambulatory Visit: Payer: BLUE CROSS/BLUE SHIELD | Admitting: Adult Health

## 2016-12-16 ENCOUNTER — Encounter: Payer: Self-pay | Admitting: Adult Health

## 2021-03-24 ENCOUNTER — Encounter: Payer: No Typology Code available for payment source | Attending: Family Medicine | Admitting: Dietician

## 2021-03-24 ENCOUNTER — Other Ambulatory Visit: Payer: Self-pay

## 2021-03-24 ENCOUNTER — Encounter: Payer: Self-pay | Admitting: Dietician

## 2021-03-24 VITALS — Ht 74.0 in | Wt 280.3 lb

## 2021-03-24 DIAGNOSIS — E119 Type 2 diabetes mellitus without complications: Secondary | ICD-10-CM | POA: Insufficient documentation

## 2021-03-24 NOTE — Progress Notes (Signed)
Diabetes Self-Management Education  Visit Type: First/Initial  Appt. Start Time: 0815 Appt. End Time: 0915  03/24/2021  [name], identified by name and date of birth, is a 50 y.o. male with a diagnosis of Diabetes: Type 2.   ASSESSMENT Pt is taking metformin BID, 1000 mg in the morning, and 500 mg in the evening. No GI disturbances.Pt reports discontinuing their atorvastatin due to feeling that it is "deteriorating their muscles", states they just don't feel right on it. Pt reports being tired of having to deal with taking medication. Pt reports following a low carb diet with salads and proteins, no sweets, no liquid calories. Pt reports beginning to work on being aware of their carbohydrate intake. Pt reports using [employer] meal delivery service 3 times a week, no red meats, fish only. Pt reports a calf strain two weeks ago, is currently wearing a walking boot. Pt is doing physical therapy once a week.  Pt usually plays volleyball twice a week for exercise, and will ride their bike on the weekends. Pt reports history of hypertension, and states they have to exercise for 2 hours get their blood pressure down. Pt has a goal weight of 199 pounds.  Height 6\' 2"  (1.88 m), weight 280 lb 4.8 oz (127.1 kg). Body mass index is 35.99 kg/m.   Diabetes Self-Management Education - 03/24/21 0833       Visit Information   Visit Type First/Initial      Initial Visit   Diabetes Type Type 2    Are you currently following a meal plan? Yes    What type of meal plan do you follow? low carb    Are you taking your medications as prescribed? No   prescribed 2,000 mg metformin, only taking 1,500   Date Diagnosed 2019      Health Coping   How would you rate your overall health? Good      Psychosocial Assessment   Patient Belief/Attitude about Diabetes Motivated to manage diabetes    Self-care barriers None    Self-management support Doctor's office;Friends    Other persons present Patient     Patient Concerns Medication    Special Needs None    Preferred Learning Style No preference indicated    Learning Readiness Contemplating    How often do you need to have someone help you when you read instructions, pamphlets, or other written materials from your doctor or pharmacy? 1 - Never    What is the last grade level you completed in school? college      Pre-Education Assessment   Patient understands the diabetes disease and treatment process. Needs Instruction    Patient understands incorporating nutritional management into lifestyle. Needs Instruction    Patient undertands incorporating physical activity into lifestyle. Needs Instruction    Patient understands using medications safely. Needs Instruction    Patient understands monitoring blood glucose, interpreting and using results Needs Instruction    Patient understands prevention, detection, and treatment of acute complications. Needs Instruction    Patient understands prevention, detection, and treatment of chronic complications. Needs Instruction    Patient understands how to develop strategies to address psychosocial issues. Needs Instruction    Patient understands how to develop strategies to promote health/change behavior. Needs Instruction      Complications   Last HgB A1C per patient/outside source 7.1 %   01/19/2021   How often do you check your blood sugar? Not recommended by provider    Have you had a  dilated eye exam in the past 12 months? No    Have you had a dental exam in the past 12 months? Yes    Are you checking your feet? Yes    How many days per week are you checking your feet? 2      Dietary Intake   Breakfast Chicken biscuit, diet coke    Lunch Chicken stir fry w/white rice, peppers, assorted veggies.    Manufacturing engineer with onions    Beverage(s) hot tea, diet coke, water      Exercise   Exercise Type ADL's;Strenuous (running)    How many days per week to you exercise? 3    How many minutes per day do  you exercise? 120    Total minutes per week of exercise 360      Patient Education   Previous Diabetes Education No   Pt has been looking into articles on diabetes.   Disease state  Definition of diabetes, type 1 and 2, and the diagnosis of diabetes;Factors that contribute to the development of diabetes    Nutrition management  Carbohydrate counting;Role of diet in the treatment of diabetes and the relationship between the three main macronutrients and blood glucose level;Meal timing in regards to the patients' current diabetes medication.    Physical activity and exercise  Role of exercise on diabetes management, blood pressure control and cardiac health.;Helped patient identify appropriate exercises in relation to his/her diabetes, diabetes complications and other health issue.    Medications Reviewed patients medication for diabetes, action, purpose, timing of dose and side effects.    Monitoring Purpose and frequency of SMBG.;Identified appropriate SMBG and/or A1C goals.    Chronic complications Relationship between chronic complications and blood glucose control;Lipid levels, blood glucose control and heart disease    Psychosocial adjustment Role of stress on diabetes;Worked with patient to identify barriers to care and solutions      Individualized Goals (developed by patient)   Nutrition Follow meal plan discussed    Physical Activity Exercise 3-5 times per week    Medications take my medication as prescribed    Monitoring  Other (comment)   Bring meter to follow up visit for instruction   Reducing Risk examine blood glucose patterns      Post-Education Assessment   Patient understands the diabetes disease and treatment process. Needs Review    Patient understands incorporating nutritional management into lifestyle. Needs Review    Patient undertands incorporating physical activity into lifestyle. Needs Review    Patient understands using medications safely. Needs Review    Patient  understands monitoring blood glucose, interpreting and using results Needs Review    Patient understands prevention, detection, and treatment of acute complications. Needs Review    Patient understands prevention, detection, and treatment of chronic complications. Needs Review    Patient understands how to develop strategies to address psychosocial issues. Needs Review    Patient understands how to develop strategies to promote health/change behavior. Needs Review      Outcomes   Expected Outcomes Demonstrated interest in learning. Expect positive outcomes    Future DMSE 4-6 wks    Program Status Not Completed             Individualized Plan for Diabetes Self-Management Training:   Learning Objective:  Patient will have a greater understanding of diabetes self-management. Patient education plan is to attend individual and/or group sessions per assessed needs and concerns.   Plan:   Patient Instructions  Work towards eating  three meals a day, about 5-6 hours apart!  Begin to recognize carbohydrates in your food choices!  Begin to build your meals using the proportions of the Balanced Plate. First, select your carb choices for the meal, make it 1/4 of your plate. Next, select your source of protein to pair with your carb choices, make it another 1/4 of your plate. Finally, complete the remaining 1/2 of your meal with a variety of non-starchy vegetables.  Your A1c goal is below 6.5% without medication for over three months.  Continue your physical activity as you were before your injury. You can also incorporate upper body exercises while your calf is healing.  Bring your glucometer to our next visit for instruction!!  Expected Outcomes:  Demonstrated interest in learning. Expect positive outcomes  Education material provided: My Plate  If problems or questions, patient to contact team via:  Phone and Email  Future DSME appointment: 4-6 wks

## 2021-03-24 NOTE — Patient Instructions (Addendum)
Work towards eating three meals a day, about 5-6 hours apart!  Begin to recognize carbohydrates in your food choices!  Begin to build your meals using the proportions of the Balanced Plate. First, select your carb choices for the meal, make it 1/4 of your plate. Next, select your source of protein to pair with your carb choices, make it another 1/4 of your plate. Finally, complete the remaining 1/2 of your meal with a variety of non-starchy vegetables.  Your A1c goal is below 6.5% without medication for over three months.  Continue your physical activity as you were before your injury. You can also incorporate upper body exercises while your calf is healing.  Bring your glucometer to our next visit for instruction!!

## 2021-05-11 ENCOUNTER — Encounter: Payer: No Typology Code available for payment source | Attending: Family Medicine | Admitting: Dietician

## 2021-05-11 ENCOUNTER — Encounter: Payer: Self-pay | Admitting: Dietician

## 2021-05-11 ENCOUNTER — Other Ambulatory Visit: Payer: Self-pay

## 2021-05-11 VITALS — Ht 74.0 in | Wt 277.3 lb

## 2021-05-11 DIAGNOSIS — E119 Type 2 diabetes mellitus without complications: Secondary | ICD-10-CM | POA: Insufficient documentation

## 2021-05-11 NOTE — Progress Notes (Signed)
Diabetes Self-Management Education  Visit Type: Follow-up  Appt. Start Time: 0850 Appt. End Time: 0930  05/11/2021  [name], identified by name and date of birth, is a 50 y.o. male with a diagnosis of Diabetes:  .   ASSESSMENT Pt is out of walking boot now, states they have been cleared for physical activity. Pt is doing low impact cardio, using elliptical. Pt brought glucometer to appointment, RD provided instruction on testing blood glucose. Pt FBG during visit was was 250 with pt meter. RD rechecked with sample meter in office, FBG was 222. Pt reports not having breakfast yet, but ate a salad late last night.  Pt reports trying to eat very low carbs. Pt will be using a different meal delivery service soon, pt will be getting vegetarian options.  Height 6\' 2"  (1.88 m), weight 277 lb 4.8 oz (125.8 kg). Body mass index is 35.6 kg/m.   Diabetes Self-Management Education - 05/11/21 05/13/21       Visit Information   Visit Type Follow-up      Complications   How often do you check your blood sugar? 0 times/day (not testing)    Fasting Blood glucose range (mg/dL) 0938   RD checked FBG during visit     Dietary Intake   Breakfast Steak biscuit from [employer]    Lunch Ham sandwich on a bagel thin, veggies    Dinner Chicken Stir fry with rice    Snack (evening) Salad with sugar-free dressing, cheese, carrots    Beverage(s) Low calorie tea, water      Exercise   Exercise Type ADL's;Light (walking / raking leaves)    How many days per week to you exercise? 3    How many minutes per day do you exercise? 30    Total minutes per week of exercise 90      Individualized Goals (developed by patient)   Nutrition Follow meal plan discussed;General guidelines for healthy choices and portions discussed    Physical Activity Exercise 3-5 times per week    Monitoring  test my blood glucose as discussed;send in my blood glucose log as discussed      Patient Self-Evaluation of Goals -  Patient rates self as meeting previously set goals (% of time)   Nutrition 25 - 50%    Physical Activity < 25%    Medications >75%    Monitoring < 25%   Pt will begin checking FBG daily now   Problem Solving 25 - 50%    Reducing Risk < 25%    Health Coping < 25%      Post-Education Assessment   Patient understands the diabetes disease and treatment process. Needs Review    Patient understands incorporating nutritional management into lifestyle. Needs Review    Patient undertands incorporating physical activity into lifestyle. Needs Review    Patient understands using medications safely. Needs Review    Patient understands monitoring blood glucose, interpreting and using results Needs Review    Patient understands prevention, detection, and treatment of acute complications. Needs Review    Patient understands prevention, detection, and treatment of chronic complications. Needs Review    Patient understands how to develop strategies to address psychosocial issues. Needs Review    Patient understands how to develop strategies to promote health/change behavior. Needs Review      Outcomes   Expected Outcomes Demonstrated interest in learning. Expect positive outcomes    Future DMSE 2 months    Program Status Not Completed  Subsequent Visit   Since your last visit have you continued or begun to take your medications as prescribed? Yes    Since your last visit have you had your blood pressure checked? No    Since your last visit have you experienced any weight changes? Loss    Weight Loss (lbs) 3    Since your last visit, are you checking your blood glucose at least once a day? No   RD provided training on glucose testing            Individualized Plan for Diabetes Self-Management Training:   Learning Objective:  Patient will have a greater understanding of diabetes self-management. Patient education plan is to attend individual and/or group sessions per assessed needs and  concerns.   Plan:   Patient Instructions  Contact your PCP about getting a prescription for a glucometer.  Check your blood sugar each morning before eating or drinking (fasting). Look for numbers under 125 mg/dL Check your blood sugar 2 hours after you begin eating a meal. Look for numbers under 180 mg/dL at all times.  Your goal A1c is below 7.0%  Make sure to have a source of carbohydrate and protein at each meal. Choose larger portions of non-starchy vegetables as often as you want. You can try a lower carb dinner and see how it affects your fasting number.   Log your food choices in your app and look for your fasting numbers to come down.  Expected Outcomes:  Demonstrated interest in learning. Expect positive outcomes  Education material provided: ADA - How to Thrive: A Guide for Your Journey with Diabetes  If problems or questions, patient to contact team via:  Phone and Email  Future DSME appointment: 2 months

## 2021-05-11 NOTE — Patient Instructions (Addendum)
Contact your PCP about getting a prescription for a glucometer.  Check your blood sugar each morning before eating or drinking (fasting). Look for numbers under 125 mg/dL Check your blood sugar 2 hours after you begin eating a meal. Look for numbers under 180 mg/dL at all times.  Your goal A1c is below 7.0%  Make sure to have a source of carbohydrate and protein at each meal. Choose larger portions of non-starchy vegetables as often as you want. You can try a lower carb dinner and see how it affects your fasting number.   Log your food choices in your app and look for your fasting numbers to come down.

## 2021-06-30 ENCOUNTER — Ambulatory Visit: Payer: No Typology Code available for payment source | Admitting: Dietician
